# Patient Record
Sex: Male | Born: 1965 | Race: White | Hispanic: No | Marital: Married | Smoking: Former smoker
Health system: Southern US, Community
[De-identification: ages and names within clinical notes are randomized; demographics above are authoritative.]

## PROBLEM LIST (undated history)

## (undated) DIAGNOSIS — E039 Hypothyroidism, unspecified: Secondary | ICD-10-CM

## (undated) DIAGNOSIS — C73 Malignant neoplasm of thyroid gland: Secondary | ICD-10-CM

## (undated) DIAGNOSIS — E079 Disorder of thyroid, unspecified: Secondary | ICD-10-CM

## (undated) DIAGNOSIS — H532 Diplopia: Secondary | ICD-10-CM

## (undated) HISTORY — PX: THYROIDECTOMY: SHX17

---

## 2003-04-07 ENCOUNTER — Encounter: Admission: RE | Admit: 2003-04-07 | Discharge: 2003-04-07 | Payer: Self-pay | Admitting: Family Medicine

## 2003-04-07 ENCOUNTER — Encounter: Payer: Self-pay | Admitting: Family Medicine

## 2003-04-20 ENCOUNTER — Encounter (INDEPENDENT_AMBULATORY_CARE_PROVIDER_SITE_OTHER): Payer: Self-pay | Admitting: *Deleted

## 2003-04-20 ENCOUNTER — Ambulatory Visit (HOSPITAL_COMMUNITY): Admission: RE | Admit: 2003-04-20 | Discharge: 2003-04-20 | Payer: Self-pay | Admitting: General Surgery

## 2003-04-20 ENCOUNTER — Encounter: Payer: Self-pay | Admitting: General Surgery

## 2003-05-15 ENCOUNTER — Encounter: Payer: Self-pay | Admitting: General Surgery

## 2003-05-21 ENCOUNTER — Encounter (INDEPENDENT_AMBULATORY_CARE_PROVIDER_SITE_OTHER): Payer: Self-pay | Admitting: *Deleted

## 2003-05-21 ENCOUNTER — Ambulatory Visit (HOSPITAL_COMMUNITY): Admission: RE | Admit: 2003-05-21 | Discharge: 2003-05-23 | Payer: Self-pay | Admitting: General Surgery

## 2003-05-22 ENCOUNTER — Encounter (INDEPENDENT_AMBULATORY_CARE_PROVIDER_SITE_OTHER): Payer: Self-pay | Admitting: *Deleted

## 2006-05-23 ENCOUNTER — Inpatient Hospital Stay (HOSPITAL_COMMUNITY): Admission: EM | Admit: 2006-05-23 | Discharge: 2006-05-25 | Payer: Self-pay | Admitting: Emergency Medicine

## 2006-07-03 ENCOUNTER — Encounter: Admission: RE | Admit: 2006-07-03 | Discharge: 2006-07-03 | Payer: Self-pay | Admitting: Endocrinology

## 2008-06-02 ENCOUNTER — Encounter: Admission: RE | Admit: 2008-06-02 | Discharge: 2008-06-02 | Payer: Self-pay | Admitting: Internal Medicine

## 2009-03-30 ENCOUNTER — Emergency Department (HOSPITAL_COMMUNITY): Admission: EM | Admit: 2009-03-30 | Discharge: 2009-03-30 | Payer: Self-pay | Admitting: Emergency Medicine

## 2009-06-08 ENCOUNTER — Encounter: Admission: RE | Admit: 2009-06-08 | Discharge: 2009-06-08 | Payer: Self-pay | Admitting: Internal Medicine

## 2009-06-23 ENCOUNTER — Encounter: Admission: RE | Admit: 2009-06-23 | Discharge: 2009-06-23 | Payer: Self-pay | Admitting: Internal Medicine

## 2009-06-23 ENCOUNTER — Other Ambulatory Visit: Admission: RE | Admit: 2009-06-23 | Discharge: 2009-06-23 | Payer: Self-pay | Admitting: Interventional Radiology

## 2009-08-09 ENCOUNTER — Encounter (HOSPITAL_COMMUNITY): Admission: RE | Admit: 2009-08-09 | Discharge: 2009-11-07 | Payer: Self-pay | Admitting: Internal Medicine

## 2010-03-17 ENCOUNTER — Encounter: Admission: RE | Admit: 2010-03-17 | Discharge: 2010-03-17 | Payer: Self-pay | Admitting: Internal Medicine

## 2010-08-28 ENCOUNTER — Encounter: Payer: Self-pay | Admitting: Internal Medicine

## 2010-09-27 ENCOUNTER — Other Ambulatory Visit (HOSPITAL_COMMUNITY): Payer: Self-pay | Admitting: Internal Medicine

## 2010-09-27 DIAGNOSIS — Z85850 Personal history of malignant neoplasm of thyroid: Secondary | ICD-10-CM

## 2010-10-24 ENCOUNTER — Ambulatory Visit (HOSPITAL_COMMUNITY)
Admission: RE | Admit: 2010-10-24 | Discharge: 2010-10-24 | Disposition: A | Discharge status: Home / Self Care | Payer: BC Managed Care – PPO | Source: Ambulatory Visit | Attending: Internal Medicine | Admitting: Internal Medicine

## 2010-10-24 DIAGNOSIS — C73 Malignant neoplasm of thyroid gland: Secondary | ICD-10-CM | POA: Insufficient documentation

## 2010-10-24 DIAGNOSIS — Z85850 Personal history of malignant neoplasm of thyroid: Secondary | ICD-10-CM

## 2010-10-25 ENCOUNTER — Encounter (HOSPITAL_COMMUNITY): Payer: BC Managed Care – PPO | Attending: Internal Medicine

## 2010-10-26 ENCOUNTER — Encounter (HOSPITAL_COMMUNITY)
Admission: RE | Admit: 2010-10-26 | Discharge: 2010-10-26 | Disposition: A | Discharge status: Home / Self Care | Payer: BC Managed Care – PPO | Source: Ambulatory Visit | Attending: Internal Medicine | Admitting: Internal Medicine

## 2010-10-28 ENCOUNTER — Encounter (HOSPITAL_COMMUNITY)
Admission: RE | Admit: 2010-10-28 | Discharge: 2010-10-28 | Disposition: A | Discharge status: Home / Self Care | Payer: BC Managed Care – PPO | Source: Ambulatory Visit | Attending: Internal Medicine | Admitting: Internal Medicine

## 2010-10-28 DIAGNOSIS — C73 Malignant neoplasm of thyroid gland: Secondary | ICD-10-CM | POA: Insufficient documentation

## 2010-10-28 MED ORDER — SODIUM IODIDE I 131 CAPSULE
4.0000 | Freq: Once | INTRAVENOUS | Status: AC | PRN
  Administered 2010-10-27: 4 via ORAL

## 2010-10-31 LAB — THYROGLOBULIN LEVEL: Thyroglobulin: <0.2 ng/mL (ref 0.0–55.0)

## 2010-10-31 LAB — THYROGLOBULIN ANTIBODY: Thyroglobulin Ab: <20 U/mL (ref <40.0)

## 2010-12-23 NOTE — Op Note (Signed)
NAME:  Damon Christensen, Damon Christensen                        ACCOUNT NO.:  [id number]   MEDICAL RECORD NO.:  [id number]                   PATIENT TYPE:  OIB   LOCATION:  5741                                 FACILITY:  [facility]   PHYSICIAN:  Jimmye Norman III, M.D.               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   PROCEDURE:  Completion left hemithyroidectomy.   SURGEON:  Jimmye Norman, M.D.   ASSISTANT:  Sandria Bales. Ezzard Standing, M.D.   ANESTHESIA:  General endotracheal.   ESTIMATED BLOOD LOSS:  50-75 mL   COMPLICATIONS:  None.   CONDITION:  Stable.   FINDINGS:  Very stuck left thyroid gland nodule with an aberrant nodule on  the posterior middle aspect of the thyroid gland very close to the recurrent  laryngeal nerve on the left side. The superior parathyroid gland was  identified and preserved in the upper portion, the inferior one was not.   DESCRIPTION OF PROCEDURE:  The patient was taken to the operating room,  placed on the table in supine position. After an adequate general anesthetic  was administered, he was prepped and draped in the usual sterile manner  exposing his neck.   The patient had three 5-0 nylons in place from his previous operation. These  were cut and opened up the subcutaneous tissue which had 3-0 Vicryls in them  and the platysmal layer. These sutures were also cut and once that was done,  we were able to put in the [place] retractor. The strap muscles had been  reapproximated using 3-0 Vicryl sutures and these were cut. We then used a  green retractor to retract them on the left side as we exposed the left lobe  and thyroid  gland.   There was some hypervascularity of this area which lead to some moderate  amount of bleeding during the initial portion of the case. We bluntly took  down the adhesions to the thyroid gland anteriorly and initially isolated  out the inferior pole. There was a fatty connected tissue structure near the  inferior pole which may have been an inferior parathyroid which was  preserved and left in place. The inferior pole vessels were taken between a  right angled clamp and 4-0 Vicryl ties. We then retracted the thyroid gland  medially taking down the vessels along the thyroid gland itself using  hemoclips small one and Metzenbaum scissors. The superior pole was also  taken down. A sling of hyothyroid muscle which was attached to the gland  which had to be taken down also. Care was taken not to damage any nervous  structures in that area; however, we did take down the superior pole between  the right angled clamp and using a 3-0 Vicryl tie. We continued to rotate  the gland medially as we pushed the connective tissue posteriorly. As we got  to the middle portion of the gland, there was a nodule which was densely  adherent to the trachea which had to be excised away using a #15 blade. This  released it from the trachea preserving the recurrent laryngeal nerve with  was identified in the tracheoesophageal groove going up posteriorly.   We subsequently took the thyroid gland off the trachea using electrocautery.  Hemostasis was obtained primarily with ties, no cautery was used near the  nerve. We did place a piece of Surgical in the sulcus between the trachea  and the esophagus and in the pocket up from the gland. We irrigated with  saline and then we closed in the standard manner.   The strap muscles were reapproximated  using interrupted 3-0 Vicryl sutures.  The platysmal layer was reapproximated using interrupted 3-0 Vicryl sutures.  The skin was closed six interrupted 5-0 nylon  sutures with intervening 1/4  inch Steri-Strips. A sterile dressing was applied including [place] wrap.                                               Kathrin Ruddy, M.D.    JW/MEDQ  D:  05/22/2003  T:  05/23/2003  Job:  [id number]

## 2010-12-23 NOTE — Op Note (Signed)
NAME:  Damon Christensen, Damon Christensen                        ACCOUNT NO.:  [id number]   MEDICAL RECORD NO.:  [id number]                   PATIENT TYPE:  OIB   LOCATION:  5741                                 FACILITY:  [facility]   PHYSICIAN:  Jimmye Norman III, M.D.               DATE OF BIRTH:  1965/11/18   DATE OF PROCEDURE:  05/21/2003  DATE OF DISCHARGE:                                 OPERATIVE REPORT   PREOPERATIVE DIAGNOSIS:  Follicular nodule of the right lobe of the thyroid  gland.   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   PROCEDURE:  Right thyroid lobectomy with isthmusectomy.   SURGEON:  Jimmye Norman, M.D.   ASSISTANT:  Sandria Bales. Ezzard Standing, M.D.   ANESTHESIA:  General endotracheal.   ESTIMATED BLOOD LOSS:  Less than 50 mL.   COMPLICATIONS:  None.   CONDITION:  Stable.   FINDINGS:  The patient had a large 3-3.5 cm nodule of the right thyroid  gland.  Posteriorly and laterally near the recurrent laryngeal nerve, there  was sclerotic attachment of it to the trachea and near the recurrent  laryngeal nerve.  This was easily dissected off, and we were able to  preserve the nerve with minimal difficulty.   OPERATION:  The patient was taken to the operating room and placed on the  table in the supine position.  After an adequate endotracheal anesthetic was  administered, he was prepped and draped in the usual sterile manner,  exposing the neck.   We marked the neck approximately 3 cm above the sternal notch using a 0 silk  tie.  We then made an incision where we marked it with an impression of the  tie on the neck using a #15 blade.  We took it down to and through the  platysma muscle and then dissected a plane superiorly and inferiorly,  identifying and getting down to the strap muscles.  A self-retaining thyroid  retractor was placed in the wound, and we subsequently split the strap  muscles superiorly and inferiorly  right through the midline.  This took [place]  down to a plane where we subsequently could get down to the thyroid gland.  On the right side, we can palpate the nodule very easily.  On the left side,  there was no evidence of malignancy or nodularity.  We used a Green  retractor to retract the hyothyroid muscles towards the right upper quadrant  as we retracted the thyroid gland medially.  The inferior thyroid vessels  were taken initially between a right angle clamp and 4-0 Vicryl ties.  We  then took down the superior pole as we retracted with the Green retractor  and retracted the muscles and pulled down on the thyroid gland towards the  patient's feet while he was in reverse Trendelenburg position.  We were able  to adequately isolate those  vessels and clamp them with a right angle clamp  and tied them off with 4-0 Vicryl ties.  We then retracted the gland more  medially, and there was one nodule which came off of the posterior aspect of  the gland near the middle portion, which was attached intermittently to the  trachea and also to the fascia laterally near the tracheoesophageal groove.  We carefully dissected this free, exposing the recurrent laryngeal nerve  just underneath this 1 cm nodule, which was attached to the gland itself.  We were able to adequately bring the gland anteriorly and medially, exposing  the isthmus.  We subsequently transected the isthmus using electrocautery.  The middle thyroid vessels were also taken between clamps and 4-0 Vicryl  ties.   Hemostasis was adequate at the end.  There were a few small vessels which  had to be clipped with small hemoclips.  However, this was not much of a  problem.  We packed and irrigated, and once the pathology came back on  frozen sections, being likely hyperplasia and/or follicular adenoma, we  closed.   The strap muscles were reapproximated using interrupted stitches of 3-0  Vicryl.  We then reapproximated the platysma layer  using buried stitches of  3-0 Vicryl, and the skin was closed using alternating 0.25-inch Steri-Strips  and simple 4-0 nylon sutures.  Once this was done, a sterile dressing was  applied, including a [place] wrap.                                               Kathrin Ruddy, M.D.    JW/MEDQ  D:  05/21/2003  T:  05/21/2003  Job:  [id number]

## 2010-12-23 NOTE — Discharge Summary (Signed)
NAMEMUNEER, LEIDER              ACCOUNT NO.:  [id number]   MEDICAL RECORD NO.:  [id number]          PATIENT TYPE:  INP   LOCATION:  5710                         FACILITY:  [facility]   PHYSICIAN:  Petra Kuba, M.D.    DATE OF BIRTH:  1965-10-26   DATE OF ADMISSION:  05/23/2006  DATE OF DISCHARGE:  05/25/2006                                 DISCHARGE SUMMARY   Mr. Selsor was admitted on October 17 with a diagnosis of GI bleed.  He was  discharged this morning on October 8 after resolution of his symptoms.   DISCHARGE DIAGNOSES:  Gastrointestinal bleed status post polypectomy.   The service was the gastroenterology service.  The attending was Dr. Vida Rigger. There were no consults.   Mr. Marlin came in on the evening of October 17 complaining of increasing  abdominal pain and what started with pink fluid in the toilet associated  with his bowel movements that had become more reddish.  He was admitted and  placed on clear liquids and IV antibiotics of Unasyn.  He had a CT of his  pelvis which showed that there was difficult infiltration of fat surrounding  the splenic flexure which may have been consistent with either a micro  perforation or some type of abrasion caused by his polypectomy.  He was  advanced from clear liquids to full diet.  He did well.  He was having no  pain when he left, and he had three formed bowel movements yesterday October  19 with no signs of blood and pain.  He was discharged to home this morning  on the same medications that he came in on, which were Levoxyl, calcium D  and a multivitamin.  He is to follow up with Dr. Evette Cristal in two to three  months or sooner if needed.   SPECIAL INSTRUCTIONS:  Call sooner if he has increased bleeding, pain.  He  was told no aspirin or arthritis medications.  Over-the-counter Tylenol is  okay.      Stephani Police, PA    ______________________________  Petra Kuba, M.D.    MLY/MEDQ  D:  05/25/2006  T:   05/27/2006  Job:  [id number]   cc:   Graylin Shiver, M.D.

## 2011-09-18 ENCOUNTER — Other Ambulatory Visit: Payer: Self-pay | Admitting: Internal Medicine

## 2011-09-18 DIAGNOSIS — C73 Malignant neoplasm of thyroid gland: Secondary | ICD-10-CM

## 2011-09-21 ENCOUNTER — Ambulatory Visit
Admission: RE | Admit: 2011-09-21 | Discharge: 2011-09-21 | Disposition: A | Discharge status: Home / Self Care | Payer: BC Managed Care – PPO | Source: Ambulatory Visit | Attending: Internal Medicine | Admitting: Internal Medicine

## 2011-09-21 DIAGNOSIS — C73 Malignant neoplasm of thyroid gland: Secondary | ICD-10-CM

## 2015-09-14 ENCOUNTER — Ambulatory Visit (HOSPITAL_BASED_OUTPATIENT_CLINIC_OR_DEPARTMENT_OTHER): Payer: Self-pay

## 2016-05-07 DIAGNOSIS — H532 Diplopia: Secondary | ICD-10-CM

## 2016-05-07 HISTORY — DX: Diplopia: H53.2

## 2016-05-24 ENCOUNTER — Observation Stay (HOSPITAL_COMMUNITY)
Admission: EM | Admit: 2016-05-24 | Discharge: 2016-05-25 | Disposition: A | Discharge status: Home / Self Care | Payer: BLUE CROSS/BLUE SHIELD | Attending: Family Medicine | Admitting: Family Medicine

## 2016-05-24 ENCOUNTER — Encounter (HOSPITAL_COMMUNITY): Payer: Self-pay | Admitting: *Deleted

## 2016-05-24 ENCOUNTER — Observation Stay (HOSPITAL_COMMUNITY): Payer: BLUE CROSS/BLUE SHIELD

## 2016-05-24 DIAGNOSIS — Z6830 Body mass index (BMI) 30.0-30.9, adult: Secondary | ICD-10-CM | POA: Diagnosis not present

## 2016-05-24 DIAGNOSIS — E669 Obesity, unspecified: Secondary | ICD-10-CM | POA: Diagnosis not present

## 2016-05-24 DIAGNOSIS — E890 Postprocedural hypothyroidism: Secondary | ICD-10-CM | POA: Diagnosis not present

## 2016-05-24 DIAGNOSIS — H539 Unspecified visual disturbance: Secondary | ICD-10-CM | POA: Diagnosis not present

## 2016-05-24 DIAGNOSIS — R079 Chest pain, unspecified: Secondary | ICD-10-CM | POA: Diagnosis present

## 2016-05-24 DIAGNOSIS — Z87891 Personal history of nicotine dependence: Secondary | ICD-10-CM | POA: Insufficient documentation

## 2016-05-24 DIAGNOSIS — E785 Hyperlipidemia, unspecified: Secondary | ICD-10-CM | POA: Insufficient documentation

## 2016-05-24 DIAGNOSIS — F1290 Cannabis use, unspecified, uncomplicated: Secondary | ICD-10-CM | POA: Insufficient documentation

## 2016-05-24 DIAGNOSIS — Z85850 Personal history of malignant neoplasm of thyroid: Secondary | ICD-10-CM | POA: Insufficient documentation

## 2016-05-24 DIAGNOSIS — Z7982 Long term (current) use of aspirin: Secondary | ICD-10-CM | POA: Insufficient documentation

## 2016-05-24 DIAGNOSIS — H532 Diplopia: Secondary | ICD-10-CM | POA: Diagnosis not present

## 2016-05-24 DIAGNOSIS — E039 Hypothyroidism, unspecified: Secondary | ICD-10-CM | POA: Diagnosis not present

## 2016-05-24 DIAGNOSIS — G43109 Migraine with aura, not intractable, without status migrainosus: Secondary | ICD-10-CM | POA: Insufficient documentation

## 2016-05-24 DIAGNOSIS — R0789 Other chest pain: Secondary | ICD-10-CM | POA: Diagnosis not present

## 2016-05-24 DIAGNOSIS — G459 Transient cerebral ischemic attack, unspecified: Secondary | ICD-10-CM | POA: Diagnosis not present

## 2016-05-24 DIAGNOSIS — Z79899 Other long term (current) drug therapy: Secondary | ICD-10-CM | POA: Diagnosis not present

## 2016-05-24 HISTORY — DX: Hypothyroidism, unspecified: E03.9

## 2016-05-24 HISTORY — DX: Malignant neoplasm of thyroid gland: C73

## 2016-05-24 HISTORY — DX: Disorder of thyroid, unspecified: E07.9

## 2016-05-24 HISTORY — DX: Diplopia: H53.2

## 2016-05-24 LAB — TROPONIN I

## 2016-05-24 LAB — APTT: aPTT: 29 seconds (ref 24–36)

## 2016-05-24 LAB — COMPREHENSIVE METABOLIC PANEL
ALBUMIN: 3.9 g/dL (ref 3.5–5.0)
ALT: 28 U/L (ref 17–63)
ANION GAP: 10 (ref 5–15)
AST: 23 U/L (ref 15–41)
Alkaline Phosphatase: 55 U/L (ref 38–126)
BUN: 12 mg/dL (ref 6–20)
CO2: 23 mmol/L (ref 22–32)
Calcium: 9.2 mg/dL (ref 8.9–10.3)
Chloride: 106 mmol/L (ref 101–111)
Creatinine, Ser: 0.83 mg/dL (ref 0.61–1.24)
GFR calc non Af Amer: >60 mL/min (ref >60)
GLUCOSE: 115 mg/dL — AB (ref 65–99)
POTASSIUM: 3.6 mmol/L (ref 3.5–5.1)
SODIUM: 139 mmol/L (ref 135–145)
Total Bilirubin: 0.6 mg/dL (ref 0.3–1.2)
Total Protein: 6.8 g/dL (ref 6.5–8.1)

## 2016-05-24 LAB — I-STAT TROPONIN, ED: TROPONIN I, POC: 0 ng/mL (ref 0.00–0.08)

## 2016-05-24 LAB — DIFFERENTIAL
BASOS PCT: 0 %
Basophils Absolute: 0 10*3/uL (ref 0.0–0.1)
EOS ABS: 0.2 10*3/uL (ref 0.0–0.7)
EOS PCT: 2 %
LYMPHS PCT: 26 %
Lymphs Abs: 2.8 10*3/uL (ref 0.7–4.0)
MONO ABS: 0.4 10*3/uL (ref 0.1–1.0)
Monocytes Relative: 4 %
NEUTROS PCT: 68 %
Neutro Abs: 7.1 10*3/uL (ref 1.7–7.7)

## 2016-05-24 LAB — CBC
HCT: 42.5 % (ref 39.0–52.0)
Hemoglobin: 15.3 g/dL (ref 13.0–17.0)
MCH: 32.3 pg (ref 26.0–34.0)
MCHC: 36 g/dL (ref 30.0–36.0)
MCV: 89.7 fL (ref 78.0–100.0)
PLATELETS: 198 10*3/uL (ref 150–400)
RBC: 4.74 MIL/uL (ref 4.22–5.81)
RDW: 12 % (ref 11.5–15.5)
WBC: 10.5 10*3/uL (ref 4.0–10.5)

## 2016-05-24 LAB — PROTIME-INR
INR: 1.02
PROTHROMBIN TIME: 13.4 s (ref 11.4–15.2)

## 2016-05-24 LAB — RAPID URINE DRUG SCREEN, HOSP PERFORMED
AMPHETAMINES: NOT DETECTED
BENZODIAZEPINES: NOT DETECTED
Barbiturates: NOT DETECTED
Cocaine: NOT DETECTED
OPIATES: NOT DETECTED
Tetrahydrocannabinol: POSITIVE — AB

## 2016-05-24 MED ORDER — ASPIRIN 300 MG RE SUPP: 300.0000 mg | Freq: Every day | RECTAL | Status: DC

## 2016-05-24 MED ORDER — SENNOSIDES-DOCUSATE SODIUM 8.6-50 MG PO TABS: 1.0000 | ORAL_TABLET | Freq: Every evening | ORAL | Status: DC | PRN

## 2016-05-24 MED ORDER — ASPIRIN 325 MG PO TABS
325.0000 mg | ORAL_TABLET | Freq: Every day | ORAL | Status: DC
  Filled 2016-05-24: qty 1

## 2016-05-24 MED ORDER — ENOXAPARIN SODIUM 40 MG/0.4ML ~~LOC~~ SOLN
40.0000 mg | SUBCUTANEOUS | Status: DC
  Administered 2016-05-25: 40 mg via SUBCUTANEOUS
  Filled 2016-05-24: qty 0.4

## 2016-05-24 MED ORDER — SODIUM CHLORIDE 0.9 % IV SOLN
INTRAVENOUS | Status: DC
  Administered 2016-05-25: 01:00:00 via INTRAVENOUS

## 2016-05-24 MED ORDER — LEVOTHYROXINE SODIUM 175 MCG PO TABS
175.0000 ug | ORAL_TABLET | Freq: Every day | ORAL | Status: DC
  Filled 2016-05-24: qty 1

## 2016-05-24 MED ORDER — STROKE: EARLY STAGES OF RECOVERY BOOK
Freq: Once | Status: AC
  Administered 2016-05-25: 16:00:00
  Filled 2016-05-24 (×3): qty 1

## 2016-05-24 NOTE — ED Provider Notes (Signed)
[facility] DEPT Provider Note   CSN: [id number] Arrival date & time: 05/24/16  1252     History   Chief Complaint Chief Complaint  Patient presents with  . Headache    HPI Damon Christensen is a 50 y.o. male.  The history is provided by the patient (joined in ED by male companion).  Eye Problem   This is a new problem. The current episode started yesterday. The problem occurs constantly. The problem has been resolved. There is a problem in both eyes. There was no injury mechanism. The patient is experiencing no pain. There is no history of trauma to the eye. Associated symptoms include blurred vision, decreased vision and double vision. Pertinent negatives include no numbness, no discharge, no photophobia, no nausea, no vomiting and no weakness. He has tried nothing for the symptoms.    Past Medical History:  Diagnosis Date  . Hypothyroidism   . Thyroid cancer ([place])   . Thyroid disease     Patient Active Problem List   Diagnosis Date Noted  . Diplopia 05/24/2016  . Chest pain 05/24/2016  . Visual disturbance 05/24/2016  . Hypothyroidism     Past Surgical History:  Procedure Laterality Date  . THYROIDECTOMY         Home Medications    Prior to Admission medications   Medication Sig Start Date End Date Taking? Authorizing Provider  levothyroxine (SYNTHROID, LEVOTHROID) 175 MCG tablet Take 175 mcg by mouth daily before breakfast.   Yes Historical Provider, MD    Family History Family History  Problem Relation Age of Onset  . Uterine cancer Mother   . Pancreatic cancer Father   . Diabetes Mellitus I Father   . Kidney disease Brother     Social History Social History  Substance Use Topics  . Smoking status: Former Research scientist (life sciences)  . Smokeless tobacco: Not on file  . Alcohol use Yes     Comment: social drinking     Allergies   Review of patient's allergies indicates no known allergies.   Review of Systems Review of Systems  Constitutional: Negative  for chills, diaphoresis, fatigue and fever.  HENT: Negative for congestion.   Eyes: Positive for blurred vision and double vision. Negative for photophobia and discharge.  Respiratory: Negative for shortness of breath.   Cardiovascular: Positive for chest pain. Negative for leg swelling.       Intermittent nonradiating episodes of chest discomfort x few weeks, none at onset of diplopia yesterday. Most recent episode of chest discomfort was today  Gastrointestinal: Negative for abdominal pain, nausea and vomiting.  Genitourinary: Negative for flank pain.  Musculoskeletal: Negative for back pain.  Skin: Negative for rash.  Neurological: Negative for tremors, facial asymmetry, speech difficulty, weakness, light-headedness, numbness and headaches.  Hematological: Does not bruise/bleed easily.  Psychiatric/Behavioral: Negative for confusion.     Physical Exam Updated Vital Signs BP 113/97   Pulse 72   Temp 97.9 F (36.6 C) (Oral)   Resp 18   SpO2 94%   Physical Exam  Constitutional: He is oriented to person, place, and time. He appears well-developed and well-nourished. No distress.  Pleasant, cooperative, well-appearing  HENT:  Head: Normocephalic and atraumatic.  Eyes: Conjunctivae and EOM are normal. Pupils are equal, round, and reactive to light. No scleral icterus.  Intact peripheral and central vision as tested  Neck: Normal range of motion. Neck supple.  Cardiovascular: Normal rate, regular rhythm and intact distal pulses.  Exam reveals no gallop and no friction rub.  Pulmonary/Chest: Effort normal and breath sounds normal. No respiratory distress.  Abdominal: Soft. He exhibits no distension. There is no tenderness.  Musculoskeletal: Normal range of motion. He exhibits no edema or tenderness.  Neurological: He is alert and oriented to person, place, and time. No cranial nerve deficit. He exhibits normal muscle tone. Coordination normal.  5/5 strength throughout b/l Ue's and  b/l Le's. Normal speech. Normal finger-to-nose, normal rapid-alternating-movements bilaterally. Intact sensation throughout  Skin: Skin is warm and dry. Capillary refill takes less than 2 seconds. No rash noted. He is not diaphoretic. No pallor.  Psychiatric: He has a normal mood and affect.  Nursing note and vitals reviewed.    ED Treatments / Results  Labs (all labs ordered are listed, but only abnormal results are displayed) Labs Reviewed  COMPREHENSIVE METABOLIC PANEL - Abnormal; Notable for the following:       Result Value   Glucose, Bld 115 (*)    All other components within normal limits  RAPID URINE DRUG SCREEN, HOSP PERFORMED - Abnormal; Notable for the following:    Tetrahydrocannabinol POSITIVE (*)    All other components within normal limits  PROTIME-INR  APTT  CBC  DIFFERENTIAL  TROPONIN I  TROPONIN I  TROPONIN I  HEMOGLOBIN A1C  LIPID PANEL  TSH  I-STAT TROPOININ, ED    EKG  EKG Interpretation None       Radiology No results found.  Procedures Procedures (including critical care time)  Medications Ordered in ED Medications  levothyroxine (SYNTHROID, LEVOTHROID) tablet 175 mcg (not administered)   stroke: mapping our early stages of recovery book (not administered)  0.9 %  sodium chloride infusion (not administered)  senna-docusate (Senokot-S) tablet 1 tablet (not administered)  enoxaparin (LOVENOX) injection 40 mg (not administered)  aspirin suppository 300 mg (not administered)    Or  aspirin tablet 325 mg (not administered)     Initial Impression / Assessment and Plan / ED Course  I have reviewed the triage vital signs and the nursing notes.  Pertinent labs & imaging results that were available during my care of the patient were reviewed by me and considered in my medical decision making (see chart for details).  Clinical Course   Damon Christensen is a 50 y.o. male with h/o thyroid cancer s/p thyroidectomy, denies all other medical  problems, who presents to ED for evaluation of 5 minute episode of sudden-onset "diagonal" diplopia yesterday while standing, making it difficult to walk, resolved spontaneously and has not reoccurred. Pt denies associated arm/leg numbness/weakness. Pt does endorse several weeks of intermittent "prisms" in b/l eyes, for which he was seen by an optometrist and told his retinas are normal, that the optometrist suspects ocular migraine. Pt denies any associated headaches. No ongoing visual disturbances, none currently. Pt also endorses several weeks of intermittent substernal nonradiating chest discomfort. States he is here for evaluation of his diplopia, concern for TIA/CVA. No correlation between occurrence of the chest discomfort and visual changes. Doubt dissection. Admitted to medicine for CVA/TIA workup, neurology consulted to follow. Recommend outpatient stress test of heart in addition to inpatient workup for TIA.  Pt condition, course, and admission were discussed with attending physician Dr. Ezequiel Essex.  Final Clinical Impressions(s) / ED Diagnoses   Final diagnoses:  Diplopia  Hypothyroidism, unspecified type    New Prescriptions New Prescriptions   No medications on file     Paralee Cancel, MD 05/24/16 [place], MD 05/25/16 [phone]

## 2016-05-24 NOTE — ED Notes (Signed)
Patient transported to MRI 

## 2016-05-24 NOTE — ED Notes (Signed)
Pt ambulated to room from waiting room, tolerated well. 

## 2016-05-24 NOTE — Consult Note (Signed)
Admission H&P    Chief Complaint: Transient diplopia.  HPI: Damon Christensen is an 50 y.o. male history of thyroid cancer and hypothyroidism who experienced an episode of diplopia on 05/23/2016 which lasted about 5 minutes. Images were clearly separated and diagonal an arrangement. There was no associated headache. He had no dizziness. There was no focal weakness. Speech remained unchanged. He is also complaining of episodes of visual change involving the left peripheral visual field the crescent shaped area of blurred vision and scintillating scotomas. Most recent episode occurred today and lasted only a few minutes. Onset was sudden rather than gradual. He had a mild headache associated with today's symptoms. MRI of his brain tonight showed no signs of an acute stroke. MRA was unremarkable.  LSN: 12:00 noon on 05/23/2016 tPA Given: No: Deficits rapidly resolved; beyond time window for treatment consideration mRankin:  Past Medical History:  Diagnosis Date  . Hypothyroidism   . Thyroid cancer ([place])   . Thyroid disease     Past Surgical History:  Procedure Laterality Date  . THYROIDECTOMY      Family History  Problem Relation Age of Onset  . Uterine cancer Mother   . Pancreatic cancer Father   . Diabetes Mellitus I Father   . Kidney disease Brother    Social History:  reports that he has quit smoking. He does not have any smokeless tobacco history on file. He reports that he drinks alcohol. He reports that he does not use drugs.  Allergies: No Known Allergies  Medications Prior to Admission  Medication Sig Dispense Refill  . levothyroxine (SYNTHROID, LEVOTHROID) 175 MCG tablet Take 175 mcg by mouth daily before breakfast.      ROS: History obtained from the patient  General ROS: negative for - chills, fatigue, fever, night sweats, weight gain or weight loss Psychological ROS: negative for - behavioral disorder, hallucinations, memory difficulties, mood swings or suicidal  ideation Ophthalmic ROS: As noted in present illness ENT ROS: negative for - epistaxis, nasal discharge, oral lesions, sore throat, tinnitus or vertigo Allergy and Immunology ROS: negative for - hives or itchy/watery eyes Hematological and Lymphatic ROS: negative for - bleeding problems, bruising or swollen lymph nodes Endocrine ROS: negative for - galactorrhea, hair pattern changes, polydipsia/polyuria or temperature intolerance Respiratory ROS: negative for - cough, hemoptysis, shortness of breath or wheezing Cardiovascular ROS: negative for - chest pain, dyspnea on exertion, edema or irregular heartbeat Gastrointestinal ROS: negative for - abdominal pain, diarrhea, hematemesis, nausea/vomiting or stool incontinence Genito-Urinary ROS: negative for - dysuria, hematuria, incontinence or urinary frequency/urgency Musculoskeletal ROS: negative for - joint swelling or muscular weakness Neurological ROS: as noted in HPI Dermatological ROS: negative for rash and skin lesion changes  Physical Examination: Blood pressure 132/77, pulse (!) 59, temperature 97.5 F (36.4 C), temperature source Oral, resp. rate 16, SpO2 98 %.  HEENT-  Normocephalic, no lesions, without obvious abnormality.  Normal external eye and conjunctiva.  Normal TM's bilaterally.  Normal auditory canals and external ears. Normal external nose, mucus membranes and septum.  Normal pharynx. Neck supple with no masses, nodes, nodules or enlargement. Cardiovascular - regular rate and rhythm, S1, S2 normal, no murmur, click, rub or gallop Lungs - chest clear, no wheezing, rales, normal symmetric air entry Abdomen - soft, non-tender; bowel sounds normal; no masses,  no organomegaly Extremities - no joint deformities, effusion, or inflammation  Neurologic Examination: Mental Status: Alert, oriented, thought content appropriate.  Speech fluent without evidence of aphasia. Able to follow commands  without difficulty. Cranial  Nerves: II-Visual fields were normal. III/IV/VI-Pupils were equal and reacted normally to light. Extraocular movements were full and conjugate.    V/VII-no facial numbness and no facial weakness. VIII-normal. X-normal speech and symmetrical palatal movement. XI: trapezius strength/neck flexion strength normal bilaterally XII-midline tongue extension with normal strength. Motor: 5/5 bilaterally with normal tone and bulk Sensory: Normal throughout. Deep Tendon Reflexes: 2+ and symmetric. Plantars: Flexor bilaterally Cerebellar: Normal finger-to-nose testing. Carotid auscultation: Normal  Results for orders placed or performed during the hospital encounter of 05/24/16 (from the past 48 hour(s))  Protime-INR     Status: None   Collection Time: 05/24/16  7:08 PM  Result Value Ref Range   Prothrombin Time 13.4 11.4 - 15.2 seconds   INR 1.02   APTT     Status: None   Collection Time: 05/24/16  7:08 PM  Result Value Ref Range   aPTT 29 24 - 36 seconds  CBC     Status: None   Collection Time: 05/24/16  7:08 PM  Result Value Ref Range   WBC 10.5 4.0 - 10.5 K/uL   RBC 4.74 4.22 - 5.81 MIL/uL   Hemoglobin 15.3 13.0 - 17.0 g/dL   HCT 42.5 39.0 - 52.0 %   MCV 89.7 78.0 - 100.0 fL   MCH 32.3 26.0 - 34.0 pg   MCHC 36.0 30.0 - 36.0 g/dL   RDW 12.0 11.5 - 15.5 %   Platelets 198 150 - 400 K/uL  Differential     Status: None   Collection Time: 05/24/16  7:08 PM  Result Value Ref Range   Neutrophils Relative % 68 %   Neutro Abs 7.1 1.7 - 7.7 K/uL   Lymphocytes Relative 26 %   Lymphs Abs 2.8 0.7 - 4.0 K/uL   Monocytes Relative 4 %   Monocytes Absolute 0.4 0.1 - 1.0 K/uL   Eosinophils Relative 2 %   Eosinophils Absolute 0.2 0.0 - 0.7 K/uL   Basophils Relative 0 %   Basophils Absolute 0.0 0.0 - 0.1 K/uL  Comprehensive metabolic panel     Status: Abnormal   Collection Time: 05/24/16  7:08 PM  Result Value Ref Range   Sodium 139 135 - 145 mmol/L   Potassium 3.6 3.5 - 5.1 mmol/L    Chloride 106 101 - 111 mmol/L   CO2 23 22 - 32 mmol/L   Glucose, Bld 115 (H) 65 - 99 mg/dL   BUN 12 6 - 20 mg/dL   Creatinine, Ser 0.83 0.61 - 1.24 mg/dL   Calcium 9.2 8.9 - 10.3 mg/dL   Total Protein 6.8 6.5 - 8.1 g/dL   Albumin 3.9 3.5 - 5.0 g/dL   AST 23 15 - 41 U/L   ALT 28 17 - 63 U/L   Alkaline Phosphatase 55 38 - 126 U/L   Total Bilirubin 0.6 0.3 - 1.2 mg/dL   GFR calc non Af Amer >60 >60 mL/min   GFR calc Af Amer >60 >60 mL/min    Comment: (NOTE) The eGFR has been calculated using the CKD EPI equation. This calculation has not been validated in all clinical situations. eGFR's persistently <60 mL/min signify possible Chronic Kidney Disease.    Anion gap 10 5 - 15  Troponin I (q 6hr x 3)     Status: None   Collection Time: 05/24/16  7:08 PM  Result Value Ref Range   Troponin I <0.03 <0.03 ng/mL  I-stat troponin, ED (not at [facility], [facility])  Status: None   Collection Time: 05/24/16  7:30 PM  Result Value Ref Range   Troponin i, poc 0.00 0.00 - 0.08 ng/mL   Comment 3            Comment: Due to the release kinetics of cTnI, a negative result within the first hours of the onset of symptoms does not rule out myocardial infarction with certainty. If myocardial infarction is still suspected, repeat the test at appropriate intervals.   Urine rapid drug screen (hosp performed)     Status: Abnormal   Collection Time: 05/24/16  8:10 PM  Result Value Ref Range   Opiates NONE DETECTED NONE DETECTED   Cocaine NONE DETECTED NONE DETECTED   Benzodiazepines NONE DETECTED NONE DETECTED   Amphetamines NONE DETECTED NONE DETECTED   Tetrahydrocannabinol POSITIVE (A) NONE DETECTED   Barbiturates NONE DETECTED NONE DETECTED    Comment:        DRUG SCREEN FOR MEDICAL PURPOSES ONLY.  IF CONFIRMATION IS NEEDED FOR ANY PURPOSE, NOTIFY LAB WITHIN 5 DAYS.        LOWEST DETECTABLE LIMITS FOR URINE DRUG SCREEN Drug Class       Cutoff (ng/mL) Amphetamine      1000 Barbiturate       200 Benzodiazepine   629 Tricyclics       528 Opiates          300 Cocaine          300 THC              50    Mr Brain Wo Contrast  Result Date: 05/24/2016 CLINICAL DATA:  Double vision beginning yesterday. Headache today. Recent diagnosis of the optical migraine. EXAM: MRI HEAD WITHOUT CONTRAST MRA HEAD WITHOUT CONTRAST TECHNIQUE: Multiplanar, multiecho pulse sequences of the brain and surrounding structures were obtained without intravenous contrast. Angiographic images of the head were obtained using MRA technique without contrast. COMPARISON:  CT 03/30/2009 FINDINGS: MRI HEAD FINDINGS Brain: Diffusion imaging does not show any acute or subacute infarction. Brainstem and cerebellum are normal. Cerebral hemispheres are normal except for a few scattered punctate foci of T2 and FLAIR signal in the white matter. These can be seen in normal individuals of this age or could represent small vessel insults either due to small-vessel disease, vasculitis, trauma or migraine related foci. No cortical or large vessel territory infarction. No mass lesion, hemorrhage, hydrocephalus or extra-axial collection. No pituitary mass. Vascular: Major vessels at the base of the brain show flow. Skull and upper cervical spine: Negative Sinuses/Orbits: Mild mucosal inflammation of the paranasal sinuses without evidence of advanced sinus disease. Other: None significant MRA HEAD FINDINGS Both internal carotid arteries are widely patent into the brain. The anterior and middle cerebral vessels are normal without proximal stenosis, aneurysm or vascular malformation. Both vertebral arteries are widely patent to the basilar. No basilar stenosis. Posterior circulation branch vessels are normal. IMPRESSION: Normal intracranial MR angiography. No acute or likely significant brain pathology. Few punctate white matter foci which can be seen in normal individuals. Alternatively, they could represent an early manifestation of small vessel  disease, evidence of previous vasculitis or trauma, or could be migraine related foci. Electronically Signed   By: Nelson Chimes M.D.   On: 05/24/2016 21:09   Mr Jodene Nam Head/brain UX Cm  Result Date: 05/24/2016 CLINICAL DATA:  Double vision beginning yesterday. Headache today. Recent diagnosis of the optical migraine. EXAM: MRI HEAD WITHOUT CONTRAST MRA HEAD WITHOUT CONTRAST TECHNIQUE: Multiplanar, multiecho pulse  sequences of the brain and surrounding structures were obtained without intravenous contrast. Angiographic images of the head were obtained using MRA technique without contrast. COMPARISON:  CT 03/30/2009 FINDINGS: MRI HEAD FINDINGS Brain: Diffusion imaging does not show any acute or subacute infarction. Brainstem and cerebellum are normal. Cerebral hemispheres are normal except for a few scattered punctate foci of T2 and FLAIR signal in the white matter. These can be seen in normal individuals of this age or could represent small vessel insults either due to small-vessel disease, vasculitis, trauma or migraine related foci. No cortical or large vessel territory infarction. No mass lesion, hemorrhage, hydrocephalus or extra-axial collection. No pituitary mass. Vascular: Major vessels at the base of the brain show flow. Skull and upper cervical spine: Negative Sinuses/Orbits: Mild mucosal inflammation of the paranasal sinuses without evidence of advanced sinus disease. Other: None significant MRA HEAD FINDINGS Both internal carotid arteries are widely patent into the brain. The anterior and middle cerebral vessels are normal without proximal stenosis, aneurysm or vascular malformation. Both vertebral arteries are widely patent to the basilar. No basilar stenosis. Posterior circulation branch vessels are normal. IMPRESSION: Normal intracranial MR angiography. No acute or likely significant brain pathology. Few punctate white matter foci which can be seen in normal individuals. Alternatively, they could  represent an early manifestation of small vessel disease, evidence of previous vasculitis or trauma, or could be migraine related foci. Electronically Signed   By: Nelson Chimes M.D.   On: 05/24/2016 21:09    Assessment: 50 y.o. male presenting with possible transient ischemic attack involving midbrain or pons, with transient diplopia. Patient is also describing symptoms suggestive of migraine equivalents with visual phenomena. However, these may well be infestation's of TIA as well, particularly with suddenness of onset of these symptoms.  Stroke Risk Factors - family history  Plan: 1. HgbA1c, fasting lipid panel 2. MRI, MRA  of the brain without contrast 3. Hypercoagulopathy panel 4. Echocardiogram 5. Carotid dopplers 6. Prophylactic therapy-Antiplatelet med: Aspirin  7. Risk factor modification 8. Telemetry monitoring  C.R. Nicole Kindred, MD [facility] [phone]  05/24/2016, 11:38 PM

## 2016-05-24 NOTE — H&P (Signed)
History and Physical    Damon Christensen [id number] DOB: July 24, 1966 DOA: 05/24/2016  Referring MD/NP/PA:   PCP: No primary care provider on file.   Patient coming from:  The patient is coming from home.  At baseline, pt is independent for most of ADL.   Chief Complaint: visual disturbance  HPI: Damon Christensen is a 50 y.o. male with medical history significant of thyroid cancer, s/p of thyroidectomy and secondary hypothyroidism, who presents with visual disturbance.  Pt states that he has intermittent visual disturbance in the past 6 months. It happened 5-6 times. He described as "prisms in his vision". Each time it lasts for about 5 minutes, then resolves spontaneously. He was seen by eye doctor two weeks ago and was told that his retina is fine. He was given a diagnosis of "ocular migraine. Pt states that he had one episode of double vision at noon yesterday, which lasted for about 5 min and resolved spontaneously. Patient states that occasionally he has numbness in the right thigh. Currently no unilateral weakness, numbness or tingling in extremities. 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   Pt also reports intermittent mild chest pain which has been going on for more than 2 years. The chest pain is located in the substernal area, pressure-like, 4-5 out of 10 in severity, nonradiating. It happens approximately once a week, lasts for about 5 min, resolvs spontaneously. Patient does not have shortness of breath, tenderness over calf areas. No fever or chills. Currently no chest pain. Patient denies nausea, vomiting, diarrhea, abdominal pain or symptoms of UTI.  ED Course: pt was found to have  normal temperature, no tachycardia, no tachypnea, oxygen saturation normal, blood pressure normal. Pending CMP and CBC. Pt is placed on tele bed for obs. Neurology will be consulted by EDP.    Review of Systems:   General: no  fevers, chills, no changes in body weight, has fatigue HEENT: has visual disturbance and ear ringing. Respiratory: no dyspnea, coughing, wheezing CV: has chest pain, no palpitations GI: no nausea, vomiting, abdominal pain, diarrhea, constipation GU: no dysuria, burning on urination, increased urinary frequency, hematuria  Ext: no leg edema Neuro: has double vision and visual disturbance. Has numbness in right thigh. Has ear ringing. Skin: no rash, no skin tear. MSK: No muscle spasm, no deformity, no limitation of range of movement in spin Heme: No easy bruising.  Travel history: No recent long distant travel.  Allergy: No Known Allergies  Past Medical History:  Diagnosis Date  . Hypothyroidism   . Thyroid cancer ([place])   . Thyroid disease     Past Surgical History:  Procedure Laterality Date  . THYROIDECTOMY      Social History:  reports that he has quit smoking. He does not have any smokeless tobacco history on file. He reports that he drinks alcohol. He reports that he does not use drugs.  Family History:  Family History  Problem Relation Age of Onset  . Uterine cancer Mother   . Pancreatic cancer Father   . Diabetes Mellitus I Father   . Kidney disease Brother      Prior to Admission medications   Medication Sig Start Date End Date Taking? Authorizing Provider  levothyroxine (SYNTHROID, LEVOTHROID) 175 MCG tablet Take 175 mcg by mouth daily before breakfast.   Yes Historical Provider, MD    Physical Exam: Vitals:   05/24/16 1743 05/24/16 1915 05/24/16 1930 05/24/16 1945  BP: 125/76 127/92 (!) 134/106 113/97  Pulse: 74 72 79 72  Resp: 18 17 (!) 27 18  Temp:      TempSrc:      SpO2: 98% 97% 98% 94%   General: Not in acute distress HEENT:       Eyes: PERRL, EOMI, no scleral icterus.       ENT: No discharge from the ears and nose, no pharynx injection, no tonsillar enlargement.        Neck: No JVD, no bruit, no mass felt. Heme: No neck lymph node  enlargement. Cardiac: S1/S2, RRR, No murmurs, No gallops or rubs. Respiratory:  No rales, wheezing, rhonchi or rubs. GI: Soft, nondistended, nontender, no rebound pain, no organomegaly, BS present. GU: No hematuria Ext: No pitting leg edema bilaterally. 2+DP/PT pulse bilaterally. Musculoskeletal: No joint deformities, No joint redness or warmth, no limitation of ROM in spin. Skin: No rashes.  Neuro: Alert, oriented X3, cranial nerves II-XII grossly intact, moves all extremities normally. Muscle strength 5/5 in all extremities, sensation to light touch intact. Brachial reflex 2+ bilaterally. Knee reflex 1+ bilaterally. Negative Babinski's sign. Normal finger to nose test. Psych: Patient is not psychotic, no suicidal or hemocidal ideation.  Labs on Admission: I have personally reviewed following labs and imaging studies  CBC:  Recent Labs Lab 05/24/16 1908  WBC 10.5  NEUTROABS 7.1  HGB 15.3  HCT 42.5  MCV 89.7  PLT 99991111   Basic Metabolic Panel: No results for input(s): NA, K, CL, CO2, GLUCOSE, BUN, CREATININE, CALCIUM, MG, PHOS in the last 168 hours. GFR: CrCl cannot be calculated (Unknown ideal weight.). Liver Function Tests: No results for input(s): AST, ALT, ALKPHOS, BILITOT, PROT, ALBUMIN in the last 168 hours. No results for input(s): LIPASE, AMYLASE in the last 168 hours. No results for input(s): AMMONIA in the last 168 hours. Coagulation Profile:  Recent Labs Lab 05/24/16 1908  INR 1.02   Cardiac Enzymes: No results for input(s): CKTOTAL, CKMB, CKMBINDEX, TROPONINI in the last 168 hours. BNP (last 3 results) No results for input(s): PROBNP in the last 8760 hours. HbA1C: No results for input(s): HGBA1C in the last 72 hours. CBG: No results for input(s): GLUCAP in the last 168 hours. Lipid Profile: No results for input(s): CHOL, HDL, LDLCALC, TRIG, CHOLHDL, LDLDIRECT in the last 72 hours. Thyroid Function Tests: No results for input(s): TSH, T4TOTAL, FREET4,  T3FREE, THYROIDAB in the last 72 hours. Anemia Panel: No results for input(s): VITAMINB12, FOLATE, FERRITIN, TIBC, IRON, RETICCTPCT in the last 72 hours. Urine analysis: No results found for: COLORURINE, APPEARANCEUR, LABSPEC, PHURINE, GLUCOSEU, HGBUR, BILIRUBINUR, KETONESUR, PROTEINUR, UROBILINOGEN, NITRITE, LEUKOCYTESUR Sepsis Labs: @LABRCNTIP (procalcitonin:4,lacticidven:4) )No results found for this or any previous visit (from the past 240 hour(s)).   Radiological Exams on Admission: No results found.   EKG: Not done in ED, will get one.   Assessment/Plan Principal Problem:   Diplopia Active Problems:   Hypothyroidism   Chest pain   Visual disturbance   Diplopia and visual disturbance: Etiology is not clear. Potential differential diagnosis includes TIA, multiple sclerosis and ocular nerve neuropathy. Per neurology, Dr. Nicole Kindred, pt's symptoms are suggestive of migraine equivalents with visual phenomena. However, TIA is also possible.   - will place pt on tele bed for obs. -Highly appreciate Dr. Nicole Kindred consultation, follow-up recommendations as follows:  1. HgbA1c, fasting lipid panel 2. MRI, MRA  of the brain without contrast 3. Hypercoagulopathy panel 4. Echocardiogram 5. Carotid dopplers 6. Prophylactic therapy-Antiplatelet med: Aspirin  7. Risk  factor modification 8. Telemetry monitoring  Hypothyroidism 2/2 to s/p of thyroidectomy due to thyroid cancer. -Continue home Synthroid -Check TSH  Chest pain: Patient has been having intermittent atypical chest pain for 2 years. Etiology is not clear. -Follow-up troponin x 3 and 2d echo -on ASA -check EKG   DVT ppx: sQ Lovenox Code Status: Full code Family Communication: Yes, patient's wife at bed side Disposition Plan:  Anticipate discharge back to previous home environment Consults called:  Neurology will be consulted by EDP Admission status: Obs / tele      Date of Service 05/24/2016    Ivor Costa [facility] Pager [phone]  If 7PM-7AM, please contact night-coverage [email or web address] Password [email or web address] 05/24/2016, 8:03 PM

## 2016-05-24 NOTE — ED Notes (Signed)
Attempted to call report

## 2016-05-24 NOTE — ED Triage Notes (Signed)
Pt reports that he has had double vision yesterday. Pt states that he then had a headache today with "prisms in his vision". 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 Pt reports that vision has improved today.

## 2016-05-25 ENCOUNTER — Observation Stay (HOSPITAL_BASED_OUTPATIENT_CLINIC_OR_DEPARTMENT_OTHER): Payer: BLUE CROSS/BLUE SHIELD

## 2016-05-25 ENCOUNTER — Observation Stay (HOSPITAL_COMMUNITY): Payer: BLUE CROSS/BLUE SHIELD

## 2016-05-25 ENCOUNTER — Encounter (HOSPITAL_COMMUNITY): Payer: Self-pay | Admitting: General Practice

## 2016-05-25 DIAGNOSIS — H532 Diplopia: Secondary | ICD-10-CM | POA: Diagnosis not present

## 2016-05-25 DIAGNOSIS — R079 Chest pain, unspecified: Secondary | ICD-10-CM

## 2016-05-25 DIAGNOSIS — H539 Unspecified visual disturbance: Secondary | ICD-10-CM

## 2016-05-25 DIAGNOSIS — G459 Transient cerebral ischemic attack, unspecified: Secondary | ICD-10-CM | POA: Diagnosis not present

## 2016-05-25 DIAGNOSIS — E039 Hypothyroidism, unspecified: Secondary | ICD-10-CM | POA: Diagnosis not present

## 2016-05-25 LAB — ECHOCARDIOGRAM COMPLETE
E decel time: 183 msec
E/e' ratio: 6.96
FS: 30 % (ref 28–44)
Height: 72 in
IVS/LV PW RATIO, ED: 0.83
LA ID, A-P, ES: 32 mm
LADIAMINDEX: 1.42 cm/m2
LAVOLA4C: 44.4 mL
LDCA: 3.14 cm2
LEFT ATRIUM END SYS DIAM: 32 mm
LV E/e' medial: 6.96
LV E/e'average: 6.96
LV PW d: 12 mm — AB (ref 0.6–1.1)
LV TDI E'MEDIAL: 7.72
LV e' LATERAL: 14 cm/s
LVOTD: 20 mm
MV Dec: 183
MV Peak grad: 4 mmHg
MV pk A vel: 54.8 m/s
MV pk E vel: 97.5 m/s
RV LATERAL S' VELOCITY: 12.2 cm/s
TDI e' lateral: 14
Weight: 3648 oz

## 2016-05-25 LAB — LIPID PANEL
CHOL/HDL RATIO: 7.9 ratio
CHOLESTEROL: 182 mg/dL (ref 0–200)
HDL: 23 mg/dL — ABNORMAL LOW (ref >40)
LDL CALC: 105 mg/dL — AB (ref 0–99)
TRIGLYCERIDES: 270 mg/dL — AB (ref <150)
VLDL: 54 mg/dL — AB (ref 0–40)

## 2016-05-25 LAB — TSH: TSH: 1.065 u[IU]/mL (ref 0.350–4.500)

## 2016-05-25 LAB — GLUCOSE, CAPILLARY: Glucose-Capillary: 94 mg/dL (ref 65–99)

## 2016-05-25 LAB — TROPONIN I

## 2016-05-25 MED ORDER — ASPIRIN 81 MG PO TABS: 81.0000 mg | ORAL_TABLET | Freq: Every day | ORAL | 0 refills | Status: AC

## 2016-05-25 NOTE — Progress Notes (Signed)
OT Cancellation Note  Patient Details Name: Damon Christensen MRN: [id number] DOB: 05-20-1966   Cancelled Treatment:    Reason Eval/Treat Not Completed: OT screened, no needs identified, will sign off. Pt reports that double vision has resolved; pt with no other deficits  Britt Bottom 05/25/2016, 11:04 AM

## 2016-05-25 NOTE — Progress Notes (Signed)
SLP Cancellation Note  Patient Details Name: Damon Christensen MRN: [id number] DOB: 1966-05-11   Cancelled treatment:       Reason Eval/Treat Not Completed: SLP screened, no needs identified, will sign off   Rokhaya Quinn, Katherene Ponto 05/25/2016, 2:49 PM

## 2016-05-25 NOTE — Discharge Summary (Signed)
Physician Discharge Summary  Damon Christensen [id number] DOB: [birth year] DOA: 05/24/2016  PCP: Gara Kroner, MD  Admit date: 05/24/2016 Discharge date: 05/25/2016  Admitted From: Home Disposition:  Home  Recommendations for Outpatient Follow-up:  1. Follow up with PCP in 1 weeks 2. Aspirin started for primary prevention 3. Follow-up patient's atypical chest pain  Discharge Condition: Stable CODE STATUS: Full code   Brief/Interim Summary:  HPI written by Dr. Ivor Costa on 05/24/2016  HPI: Damon Christensen is a 50 y.o. male with medical history significant of thyroid cancer, s/p of thyroidectomy and secondary hypothyroidism, who presents with visual disturbance.  Pt states that he has intermittent visual disturbance in the past 6 months. It happened 5-6 times. He described as "prisms in his vision". Each time it lasts for about 5 minutes, then resolves spontaneously. He was seen by eye doctor two weeks ago and was told that his retina is fine. He was given a diagnosis of "ocular migraine. Pt states that he had one episode of double vision at noon yesterday, which lasted for about 5 min and resolved spontaneously. Patient states that occasionally he has numbness in the right thigh. Currently no unilateral weakness, numbness or tingling in extremities. 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   Pt also reports intermittent mild chest pain which has been going on for more than 2 years. The chest pain is located in the substernal area, pressure-like, 4-5 out of 10 in severity, nonradiating. It happens approximately once a week, lasts for about 5 min, resolvs spontaneously. Patient does not have shortness of breath, tenderness over calf areas. No fever or chills. Currently no chest pain. Patient denies nausea, vomiting, diarrhea, abdominal pain or symptoms of UTI.  ED Course: pt was found to have  normal temperature, no  tachycardia, no tachypnea, oxygen saturation normal, blood pressure normal. Pending CMP and CBC. Pt is placed on tele bed for obs. Neurology will be consulted by EDP.    Hospital course:  Diplopia Visual disturbance Patient evaluated by neurology. MRI/MRA negative for acute CVA. Preliminary carotid doppler results negative for significant stenosis. Echocardiogram normal. Patient's symptoms resolved before admission. Patient's symptoms likely secondary to ocular migraine as deduced by neurology/stroke team and patient's ophthalmologist. Aspirin started for primary prevention. LDL elevated but may be improved with dietary modification. This can be discussed as an outpatient.  Hypothyroidism Continued synthroid. TSH within normal limits.  Chest pain Atypical and occurring for the past two years. Patient reports negative stress test. EKG unremarkable for signs of ACS. Echocardiogram normal. Troponin negative.   Discharge Diagnoses:  Principal Problem:   Diplopia Active Problems:   Hypothyroidism   Chest pain   Visual disturbance   Transient cerebral ischemia    Discharge Instructions     Medication List    TAKE these medications   aspirin 81 MG tablet Take 1 tablet (81 mg total) by mouth daily. Start taking on:  05/26/2016   levothyroxine 175 MCG tablet Commonly known as:  SYNTHROID, LEVOTHROID Take 175 mcg by mouth daily before breakfast.      Follow-up Information    SWAYNE,DAVID W, MD. Schedule an appointment as soon as possible for a visit in 1 week(s).   Specialty:  Family Medicine Why:  Hospital follow-up Contact information: [place] [address] [phone]          No Known Allergies  Consultations:  Neurology/Stroke team  Procedures/Studies: Mr Brain Wo Contrast  Result Date: 05/24/2016 CLINICAL DATA:  Double vision beginning yesterday. Headache today. Recent diagnosis of the optical migraine. EXAM: MRI HEAD  WITHOUT CONTRAST MRA HEAD WITHOUT CONTRAST TECHNIQUE: Multiplanar, multiecho pulse sequences of the brain and surrounding structures were obtained without intravenous contrast. Angiographic images of the head were obtained using MRA technique without contrast. COMPARISON:  CT 03/30/2009 FINDINGS: MRI HEAD FINDINGS Brain: Diffusion imaging does not show any acute or subacute infarction. Brainstem and cerebellum are normal. Cerebral hemispheres are normal except for a few scattered punctate foci of T2 and FLAIR signal in the white matter. These can be seen in normal individuals of this age or could represent small vessel insults either due to small-vessel disease, vasculitis, trauma or migraine related foci. No cortical or large vessel territory infarction. No mass lesion, hemorrhage, hydrocephalus or extra-axial collection. No pituitary mass. Vascular: Major vessels at the base of the brain show flow. Skull and upper cervical spine: Negative Sinuses/Orbits: Mild mucosal inflammation of the paranasal sinuses without evidence of advanced sinus disease. Other: None significant MRA HEAD FINDINGS Both internal carotid arteries are widely patent into the brain. The anterior and middle cerebral vessels are normal without proximal stenosis, aneurysm or vascular malformation. Both vertebral arteries are widely patent to the basilar. No basilar stenosis. Posterior circulation branch vessels are normal. IMPRESSION: Normal intracranial MR angiography. No acute or likely significant brain pathology. Few punctate white matter foci which can be seen in normal individuals. Alternatively, they could represent an early manifestation of small vessel disease, evidence of previous vasculitis or trauma, or could be migraine related foci. Electronically Signed   By: Nelson Chimes M.D.   On: 05/24/2016 21:09   Mr Jodene Nam Head/brain [id number] Cm  Result Date: 05/24/2016 CLINICAL DATA:  Double vision beginning yesterday. Headache today. Recent  diagnosis of the optical migraine. EXAM: MRI HEAD WITHOUT CONTRAST MRA HEAD WITHOUT CONTRAST TECHNIQUE: Multiplanar, multiecho pulse sequences of the brain and surrounding structures were obtained without intravenous contrast. Angiographic images of the head were obtained using MRA technique without contrast. COMPARISON:  CT 03/30/2009 FINDINGS: MRI HEAD FINDINGS Brain: Diffusion imaging does not show any acute or subacute infarction. Brainstem and cerebellum are normal. Cerebral hemispheres are normal except for a few scattered punctate foci of T2 and FLAIR signal in the white matter. These can be seen in normal individuals of this age or could represent small vessel insults either due to small-vessel disease, vasculitis, trauma or migraine related foci. No cortical or large vessel territory infarction. No mass lesion, hemorrhage, hydrocephalus or extra-axial collection. No pituitary mass. Vascular: Major vessels at the base of the brain show flow. Skull and upper cervical spine: Negative Sinuses/Orbits: Mild mucosal inflammation of the paranasal sinuses without evidence of advanced sinus disease. Other: None significant MRA HEAD FINDINGS Both internal carotid arteries are widely patent into the brain. The anterior and middle cerebral vessels are normal without proximal stenosis, aneurysm or vascular malformation. Both vertebral arteries are widely patent to the basilar. No basilar stenosis. Posterior circulation branch vessels are normal. IMPRESSION: Normal intracranial MR angiography. No acute or likely significant brain pathology. Few punctate white matter foci which can be seen in normal individuals. Alternatively, they could represent an early manifestation of small vessel disease, evidence of previous vasculitis or trauma, or could be migraine related foci. Electronically Signed   By: Nelson Chimes M.D.   On: 05/24/2016 21:09    Echocardiogram (05/25/2016) Study Conclusions  - Left ventricle: The cavity  size was normal. Systolic function was   normal. The estimated ejection fraction was in the range of 55%   to 60%. Wall motion was normal; there were no regional wall   motion abnormalities.   Subjective: Patient reports no issues overnight. No deficits  Discharge Exam: Vitals:   05/25/16 0831 05/25/16 1044  BP: 118/84 118/66  Pulse: 70 68  Resp:  16  Temp: 98.3 F (36.8 C) 97.5 F (36.4 C)   Vitals:   05/25/16 0430 05/25/16 0600 05/25/16 0831 05/25/16 1044  BP: 105/63 107/83 118/84 118/66  Pulse: (!) 58 61 70 68  Resp:    16  Temp:  98.3 F (36.8 C) 98.3 F (36.8 C) 97.5 F (36.4 C)  TempSrc:  Oral Oral Oral  SpO2:   98% 98%  Weight:   103.4 kg (228 lb)   Height:   6' (1.829 m)     General: Pt is alert, awake, not in acute distress Eyes: EOMI Cardiovascular: RRR, S1/S2 +, no rubs, no gallops Respiratory: CTA bilaterally, no wheezing, no rhonchi Abdominal: Soft, NT, ND, bowel sounds + Neurological: Alert, oriented, CN intact, 5/5 strength. Reflexes 2+ and equal bilaterally Extremities: no edema, no cyanosis    The results of significant diagnostics from this hospitalization (including imaging, microbiology, ancillary and laboratory) are listed below for reference.     Microbiology: No results found for this or any previous visit (from the past 240 hour(s)).   Labs: BNP (last 3 results) No results for input(s): BNP in the last 8760 hours. Basic Metabolic Panel:  Recent Labs Lab 05/24/16 1908  NA 139  K 3.6  CL 106  CO2 23  GLUCOSE 115*  BUN 12  CREATININE 0.83  CALCIUM 9.2   Liver Function Tests:  Recent Labs Lab 05/24/16 1908  AST 23  ALT 28  ALKPHOS 55  BILITOT 0.6  PROT 6.8  ALBUMIN 3.9   No results for input(s): LIPASE, AMYLASE in the last 168 hours. No results for input(s): AMMONIA in the last 168 hours. CBC:  Recent Labs Lab 05/24/16 1908  WBC 10.5  NEUTROABS 7.1  HGB 15.3  HCT 42.5  MCV 89.7  PLT 198   Cardiac  Enzymes:  Recent Labs Lab 05/24/16 1908 05/25/16 0132 05/25/16 0717  TROPONINI <0.03 <0.03 <0.03   BNP: Invalid input(s): POCBNP CBG:  Recent Labs Lab 05/25/16 0757  GLUCAP 94   D-Dimer No results for input(s): DDIMER in the last 72 hours. Hgb A1c No results for input(s): HGBA1C in the last 72 hours. Lipid Profile  Recent Labs  05/25/16 0132  CHOL 182  HDL 23*  LDLCALC 105*  TRIG 270*  CHOLHDL 7.9   Thyroid function studies  Recent Labs  05/25/16 0132  TSH 1.065   Anemia work up No results for input(s): VITAMINB12, FOLATE, FERRITIN, TIBC, IRON, RETICCTPCT in the last 72 hours. Urinalysis No results found for: COLORURINE, APPEARANCEUR, LABSPEC, [place], GLUCOSEU, HGBUR, BILIRUBINUR, KETONESUR, PROTEINUR, UROBILINOGEN, NITRITE, LEUKOCYTESUR Sepsis Labs Invalid input(s): PROCALCITONIN,  WBC,  LACTICIDVEN Microbiology No results found for this or any previous visit (from the past 240 hour(s)).   Time coordinating discharge: Over 30 minutes  SIGNED:   Cordelia Poche, MD [facility] 05/25/2016, 2:08 PM Pager [phone]  If 7PM-7AM, please contact night-coverage [email or web address] Password [email or web address]

## 2016-05-25 NOTE — Progress Notes (Signed)
STROKE TEAM PROGRESS NOTE   HISTORY OF PRESENT ILLNESS (per record) Damon Christensen is an 50 y.o. male history of thyroid cancer and hypothyroidism who experienced an episode of diplopia on 05/23/2016 which lasted about 5 minutes. Images were clearly separated and diagonal an arrangement. There was no associated headache. He had no dizziness. There was no focal weakness. Speech remained unchanged. He is also complaining of episodes of visual change involving the left peripheral visual field the crescent shaped area of blurred vision and scintillating scotomas. Most recent episode occurred today and lasted only a few minutes. Onset was sudden rather than gradual. He had a mild headache associated with today's symptoms. MRI of his brain tonight showed no signs of an acute stroke. MRA was unremarkable. He was LKW at 12:00 noon on 05/23/2016. Patient was not administered IV t-PA secondary to deficits rapidly resolved; beyond time window for treatment consideration. He was admitted for further evaluation and treatment.   SUBJECTIVE (INTERVAL HISTORY) His wife is at the bedside.  Overall he feels his condition is completely resolved. Wife reports he has frequent HA, more than the average person.   OBJECTIVE Temp:  [97.5 F (36.4 C)-98.3 F (36.8 C)] 98.3 F (36.8 C) (10/19 0831) Pulse Rate:  [53-80] 70 (10/19 0831) Cardiac Rhythm: Normal sinus rhythm (10/19 0705) Resp:  [14-27] 18 (10/19 0127) BP: (83-138)/(46-106) 118/84 (10/19 0831) SpO2:  [91 %-100 %] 98 % (10/19 0831) Weight:  [103.4 kg (228 lb)] 103.4 kg (228 lb) (10/19 0831)  CBC:  Recent Labs Lab 05/24/16 1908  WBC 10.5  NEUTROABS 7.1  HGB 15.3  HCT 42.5  MCV 89.7  PLT 99991111    Basic Metabolic Panel:  Recent Labs Lab 05/24/16 1908  NA 139  K 3.6  CL 106  CO2 23  GLUCOSE 115*  BUN 12  CREATININE 0.83  CALCIUM 9.2    Lipid Panel:    Component Value Date/Time   CHOL 182 05/25/2016 0132   TRIG 270 (H) 05/25/2016 0132    HDL 23 (L) 05/25/2016 0132   CHOLHDL 7.9 05/25/2016 0132   VLDL 54 (H) 05/25/2016 0132   LDLCALC 105 (H) 05/25/2016 0132   HgbA1c: No results found for: HGBA1C Urine Drug Screen:    Component Value Date/Time   LABOPIA NONE DETECTED 05/24/2016 2010   COCAINSCRNUR NONE DETECTED 05/24/2016 2010   LABBENZ NONE DETECTED 05/24/2016 2010   AMPHETMU NONE DETECTED 05/24/2016 2010   THCU POSITIVE (A) 05/24/2016 2010   LABBARB NONE DETECTED 05/24/2016 2010      IMAGING  Mr Brain Wo Contrast  Result Date: 05/24/2016 CLINICAL DATA:  Double vision beginning yesterday. Headache today. Recent diagnosis of the optical migraine. EXAM: MRI HEAD WITHOUT CONTRAST MRA HEAD WITHOUT CONTRAST TECHNIQUE: Multiplanar, multiecho pulse sequences of the brain and surrounding structures were obtained without intravenous contrast. Angiographic images of the head were obtained using MRA technique without contrast. COMPARISON:  CT 03/30/2009 FINDINGS: MRI HEAD FINDINGS Brain: Diffusion imaging does not show any acute or subacute infarction. Brainstem and cerebellum are normal. Cerebral hemispheres are normal except for a few scattered punctate foci of T2 and FLAIR signal in the white matter. These can be seen in normal individuals of this age or could represent small vessel insults either due to small-vessel disease, vasculitis, trauma or migraine related foci. No cortical or large vessel territory infarction. No mass lesion, hemorrhage, hydrocephalus or extra-axial collection. No pituitary mass. Vascular: Major vessels at the base of the brain show flow. Skull and  upper cervical spine: Negative Sinuses/Orbits: Mild mucosal inflammation of the paranasal sinuses without evidence of advanced sinus disease. Other: None significant MRA HEAD FINDINGS Both internal carotid arteries are widely patent into the brain. The anterior and middle cerebral vessels are normal without proximal stenosis, aneurysm or vascular malformation. Both  vertebral arteries are widely patent to the basilar. No basilar stenosis. Posterior circulation branch vessels are normal. IMPRESSION: Normal intracranial MR angiography. No acute or likely significant brain pathology. Few punctate white matter foci which can be seen in normal individuals. Alternatively, they could represent an early manifestation of small vessel disease, evidence of previous vasculitis or trauma, or could be migraine related foci. Electronically Signed   By: Nelson Chimes M.D.   On: 05/24/2016 21:09   Mr Jodene Nam Head/brain [id number] Cm  Result Date: 05/24/2016 CLINICAL DATA:  Double vision beginning yesterday. Headache today. Recent diagnosis of the optical migraine. EXAM: MRI HEAD WITHOUT CONTRAST MRA HEAD WITHOUT CONTRAST TECHNIQUE: Multiplanar, multiecho pulse sequences of the brain and surrounding structures were obtained without intravenous contrast. Angiographic images of the head were obtained using MRA technique without contrast. COMPARISON:  CT 03/30/2009 FINDINGS: MRI HEAD FINDINGS Brain: Diffusion imaging does not show any acute or subacute infarction. Brainstem and cerebellum are normal. Cerebral hemispheres are normal except for a few scattered punctate foci of T2 and FLAIR signal in the white matter. These can be seen in normal individuals of this age or could represent small vessel insults either due to small-vessel disease, vasculitis, trauma or migraine related foci. No cortical or large vessel territory infarction. No mass lesion, hemorrhage, hydrocephalus or extra-axial collection. No pituitary mass. Vascular: Major vessels at the base of the brain show flow. Skull and upper cervical spine: Negative Sinuses/Orbits: Mild mucosal inflammation of the paranasal sinuses without evidence of advanced sinus disease. Other: None significant MRA HEAD FINDINGS Both internal carotid arteries are widely patent into the brain. The anterior and middle cerebral vessels are normal without proximal  stenosis, aneurysm or vascular malformation. Both vertebral arteries are widely patent to the basilar. No basilar stenosis. Posterior circulation branch vessels are normal. IMPRESSION: Normal intracranial MR angiography. No acute or likely significant brain pathology. Few punctate white matter foci which can be seen in normal individuals. Alternatively, they could represent an early manifestation of small vessel disease, evidence of previous vasculitis or trauma, or could be migraine related foci. Electronically Signed   By: Nelson Chimes M.D.   On: 05/24/2016 21:09       PHYSICAL EXAM Pleasant middle aged 80 male not in distress.  . Afebrile. Head is nontraumatic. Neck is supple without bruit.    Cardiac exam no murmur or gallop. Lungs are clear to auscultation. Distal pulses are well felt. Neurological Exam ;  Awake  Alert oriented x 3. Normal speech and language.eye movements full without nystagmus.fundi were not visualized. Vision acuity and fields appear normal. Hearing is normal. Palatal movements are normal. Face symmetric. Tongue midline. Normal strength, tone, reflexes and coordination. Normal sensation. Gait deferred.  ASSESSMENT/PLAN Mr. Damon Christensen is a 50 y.o. male with history of thyroid cancer presenting with isolated double vision x 5 mins, recurrent transient visual aura. He did not receive IV t-PA due to deficits rapidly resolved; beyond time window for treatment consideration.   Ocular Migraine, doubt TIA  Does not describe typical migraine symptoms. Has occurred 6x pat 6 months, twice in past 1 week  OTC medications do not help  MRI  No acute stroke  MRA  normal  Carotid Doppler  pending   2D Echo  pending   LDL 105  HgbA1c pending  Lovenox 40 mg sq daily for VTE prophylaxis  Diet Heart Room service appropriate? Yes; Fluid consistency: Thin  No antithrombotic prior to admission, now on aspirin 325 mg daily  Therapy recommendations:  No therapy  needs  Disposition:  Return home  Hyperlipidemia  Home meds:  No statin  LDL 105  Given no stroke, recommend diet control, continuation of weight loss (recent 20# weight loss)  Other Stroke Risk Factors  Former Cigarette smoker  ETOH use, advised to drink no more than 2 drink(s) a day  THC use, UDS positive on admission  Obesity, Body mass index is 30.92 kg/m., recommend weight loss, diet and exercise as appropriate   Other Active Problems  Intermittent atypical chest pain  Hospital day # 0  Radene Journey [facility] [place] for Pager information 05/25/2016 10:12 AM  I have personally examined this patient, reviewed notes, independently viewed imaging studies, participated in medical decision making and plan of care.ROS completed by me personally and pertinent positives fully documented  I have made any additions or clarifications directly to the above note. Agree with note above. He has history of recurrent transient episodes of crescent-shaped lesion scintillating scotomas likely optical migraines. He had somewhat different episode yesterday which also is in the migraine variant. MRI scan was negative for acute infarct, structural lesion. Recommend finish ongoing stroke evaluation and follow-up as an outpatient as needed. Greater than 50% time during this 25 minute visit was spent on counseling and coordination of care about migraine, stroke and answering questions  Antony Contras, MD Medical Director [place] Pager: [phone] 05/25/2016 3:18 PM  To contact Stroke Continuity provider, please refer to [email or web address] After hours, contact General Neurology

## 2016-05-25 NOTE — Progress Notes (Signed)
VASCULAR LAB PRELIMINARY  PRELIMINARY  PRELIMINARY  PRELIMINARY  Carotid duplex completed.    Preliminary report: No significant ICA stenosis.  Vertebral artery flow is antegrade.   Nabiha Planck, RVT 05/25/2016, 1:31 PM

## 2016-05-25 NOTE — Care Management Note (Signed)
Case Management Note  Patient Details  Name: Damon Christensen MRN: [id number] Date of Birth: 03/15/1966  Subjective/Objective:                 In obs for diplopia, ruled out CVA, anticipate DC to home self care today.   Action/Plan:   Expected Discharge Date:                  Expected Discharge Plan:  Home/Self Care  In-House Referral:  NA  Discharge planning Services  CM Consult  Post Acute Care Choice:  NA Choice offered to:  NA  DME Arranged:  N/A DME Agency:  NA  HH Arranged:  NA HH Agency:  NA  Status of Service:  Completed, signed off  If discussed at [place] of Stay Meetings, dates discussed:    Additional Comments:  Carles Collet, RN 05/25/2016, 1:22 PM

## 2016-05-25 NOTE — Progress Notes (Signed)
  Echocardiogram 2D Echocardiogram has been performed.  Cynithia Hakimi 05/25/2016, 3:00 PM

## 2016-05-25 NOTE — Discharge Instructions (Signed)
Damon Christensen, you were admitted because of your double/blurry vision which was concerning for a stroke vs mini-stroke. Your workup has been negative for a stroke, and after evaluation by the neurologist, occular migraine is the leading diagnosis. You have been started on a daily aspirin. Your cholesterol was slightly high, but a cholesterol lowering drug was not started on this admission. This can be discussed with your primary care physician.

## 2016-05-25 NOTE — Progress Notes (Signed)
PT Cancellation Note  Patient Details Name: Damon Christensen MRN: [id number] DOB: Jan 15, 1966   Cancelled Treatment:    Reason Eval/Treat Not Completed: Patient declined, no reason specified (feels he is doing fine, at baseline).  Asked pt and wife and nurse to call PT if he changes his mind.   Ramond Dial 05/25/2016, 9:54 AM    Mee Hives, PT MS Acute Rehab Dept. Number: [place] and [place]

## 2016-05-26 LAB — HEMOGLOBIN A1C
Hgb A1c MFr Bld: 5.1 % (ref 4.8–5.6)
Mean Plasma Glucose: 100 mg/dL

## 2016-05-28 LAB — VAS US CAROTID
LCCAPDIAS: 21 cm/s
LCCAPSYS: 88 cm/s
LEFT ECA DIAS: -11 cm/s
LEFT VERTEBRAL DIAS: -20 cm/s
LICADDIAS: -24 cm/s
LICAPSYS: -63 cm/s
Left CCA dist dias: -27 cm/s
Left CCA dist sys: -73 cm/s
Left ICA dist sys: -53 cm/s
Left ICA prox dias: -20 cm/s
RCCADSYS: -46 cm/s
RCCAPDIAS: -12 cm/s
RIGHT ECA DIAS: -14 cm/s
RIGHT VERTEBRAL DIAS: 10 cm/s
Right CCA prox sys: -54 cm/s

## 2017-04-28 IMAGING — MR MR MRA HEAD W/O CM
9 of 11 series · 30 of 48 positions shown · non-contrast
Comparison: CT 03/30/2009

CLINICAL DATA: Double vision beginning yesterday. Headache today.
Recent diagnosis of the optical migraine.

EXAM:
MRI HEAD WITHOUT CONTRAST
MRA HEAD WITHOUT CONTRAST
TECHNIQUE: Multiplanar, multiecho pulse sequences of the brain and surrounding
structures were obtained without intravenous contrast. Angiographic
images of the head were obtained using MRA technique without
contrast.

[Series 3: DWI · axial · 3.0mm · 0.94mm/px · z∈[-47,+93]mm · 6 of 96 slices shown (1 of 2)]
[im 1/96]
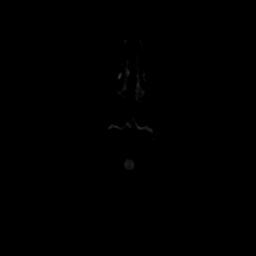
[im 20/96]
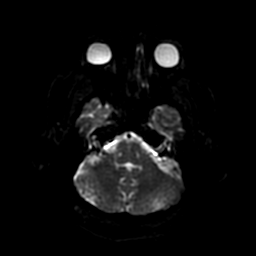
[im 39/96]
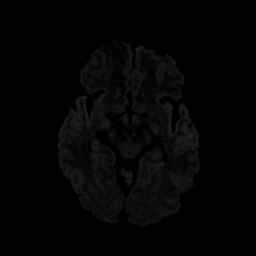
[im 58/96]
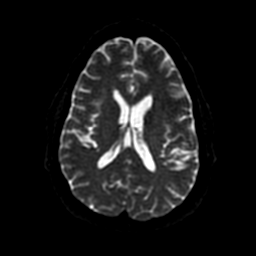
[im 77/96]
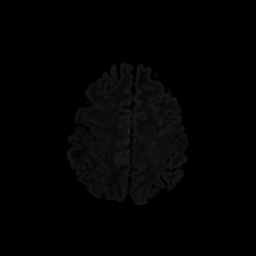
[im 96/96]
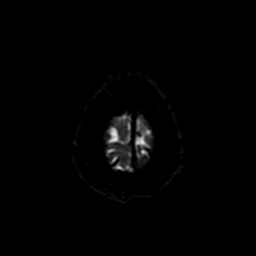

[Series 4: T2 · axial · 5.0mm · 0.47mm/px · z∈[-46,+92]mm · 2 of 24 slices shown (1 of 2)]
[im 1/24]
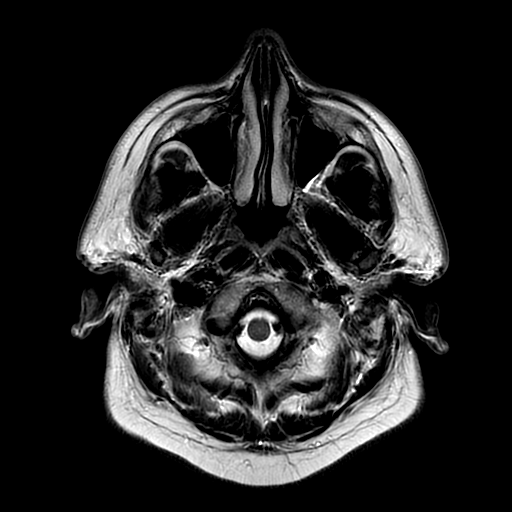
[im 24/24]
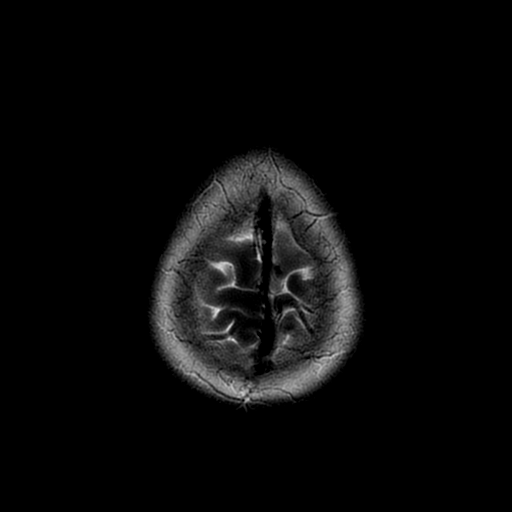

[Series 5: DWI · coronal · 4.0mm · 0.94mm/px · 5 of 72 slices shown (2 of 2)]
[im 1/72]
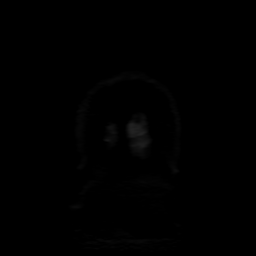
[im 18/72]
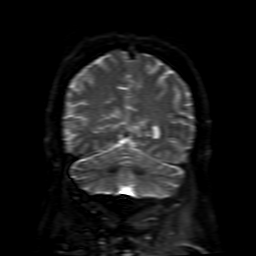
[im 36/72]
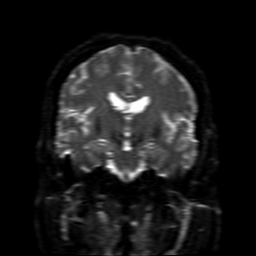
[im 54/72]
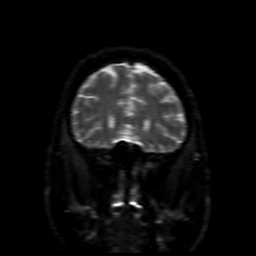
[im 72/72]
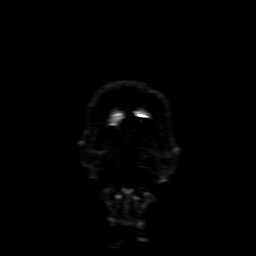

[Series 6: ax (id) 2 · axial · 1.0mm · 0.43mm/px · z∈[-55,-2]mm · 5 of 184 slices shown]
[im 1/184]
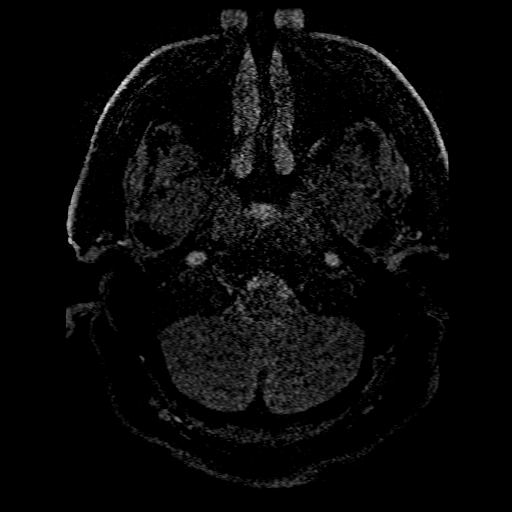
[im 31/184]
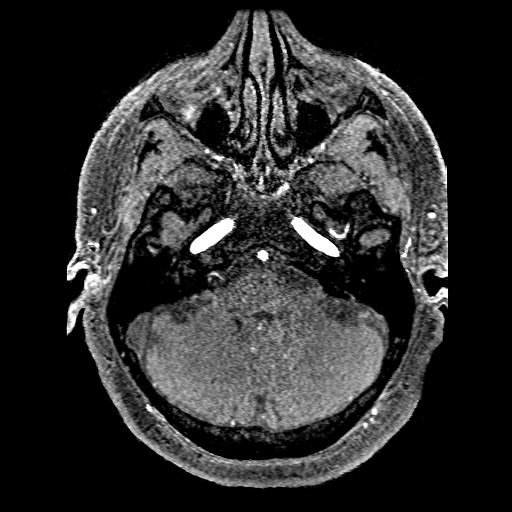
[im 62/184]
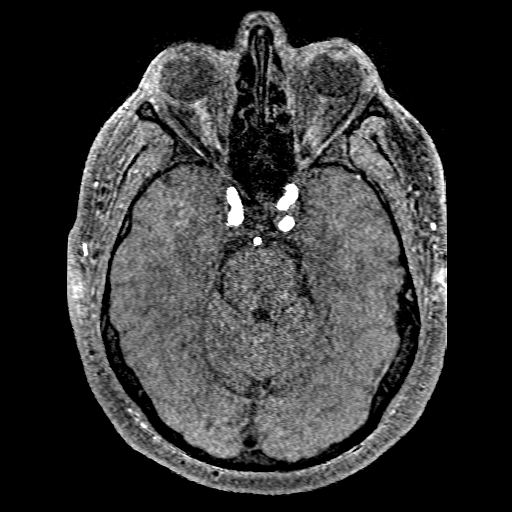
[im 77/184]
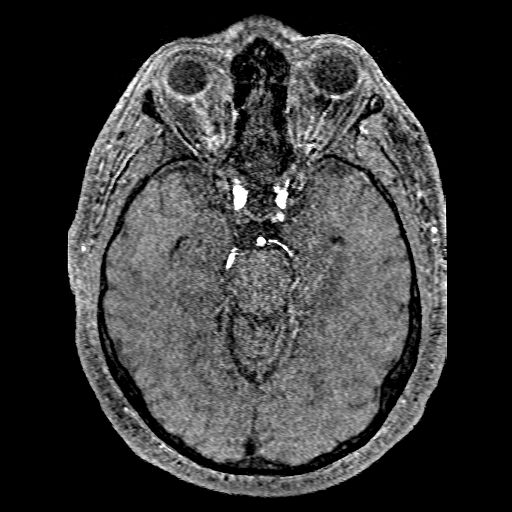
[im 107/184]
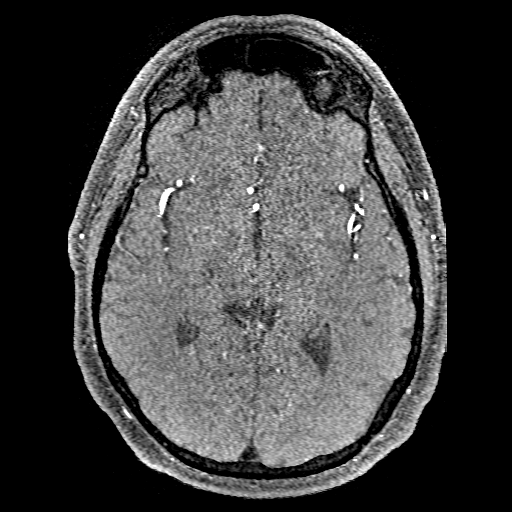

[Series 7: FLAIR · axial · 5.0mm · 0.47mm/px · z∈[-46,+92]mm · 2 of 24 slices shown (1 of 2)]
[im 1/24]
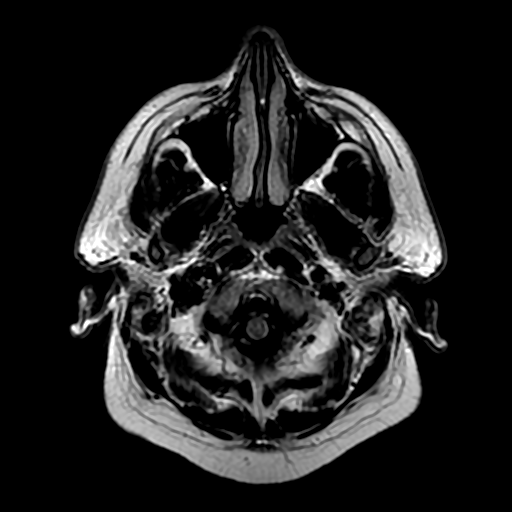
[im 24/24]
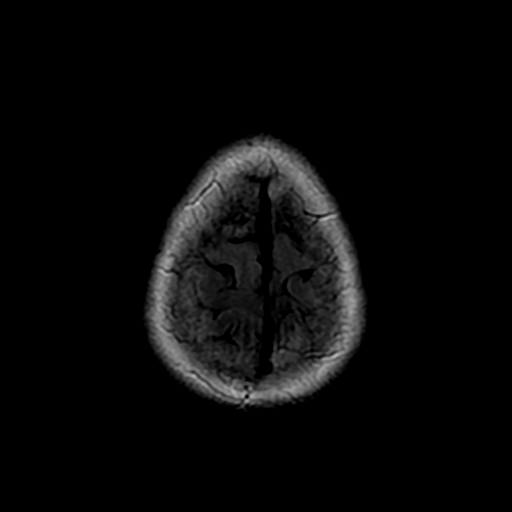

[Series 9: FLAIR · sagittal · 5.0mm · 0.51mm/px · 2 of 23 slices shown (2 of 2)]
[im 1/23]
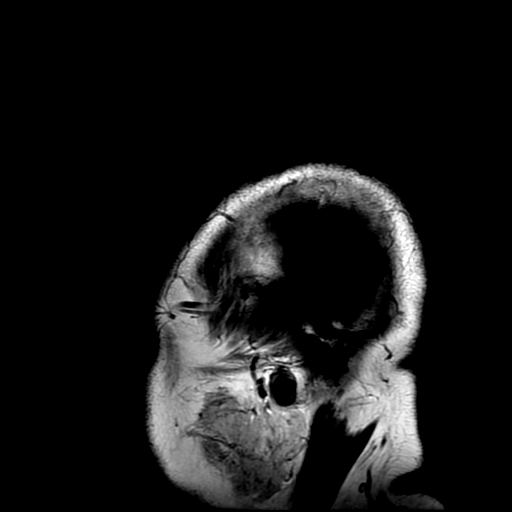
[im 23/23]
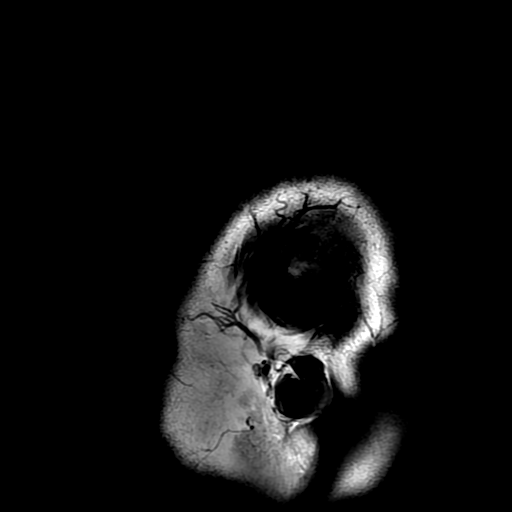

[Series 11: T2 · coronal · 5.0mm · 0.39mm/px · 2 of 30 slices shown (2 of 2)]
[im 1/30]
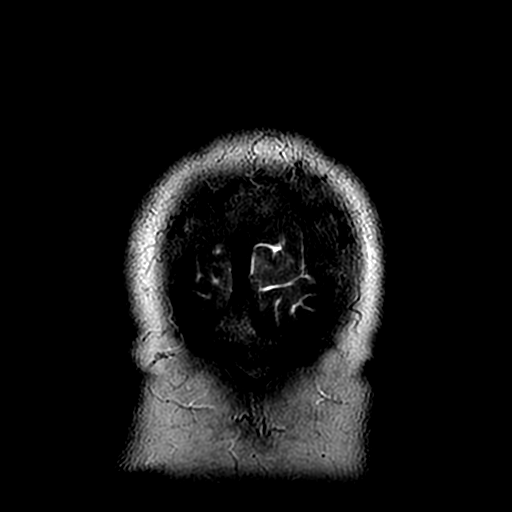
[im 30/30]
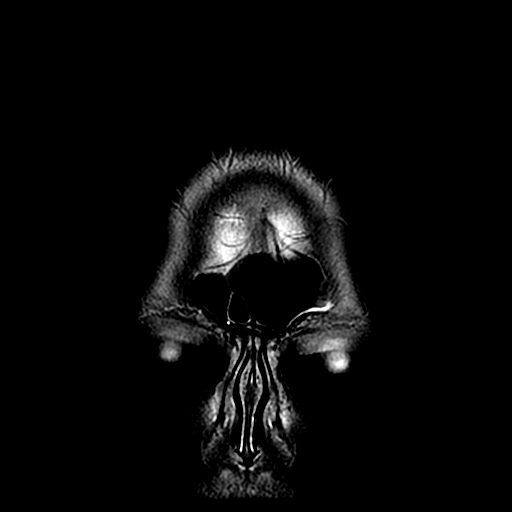

[Series 350: ADC · axial · 3.0mm · 0.94mm/px · z∈[-47,+93]mm · 3 of 48 slices shown (1 of 2)]
[im 1/48]
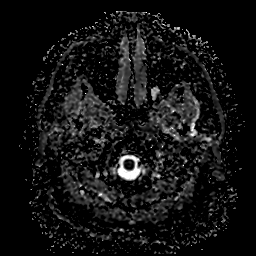
[im 24/48]
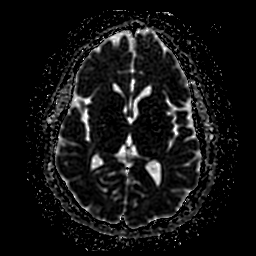
[im 48/48]
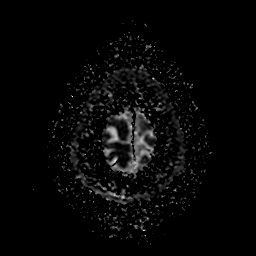

[Series 550: ADC · coronal · 4.0mm · 0.94mm/px · 3 of 36 slices shown (2 of 2)]
[im 1/36]
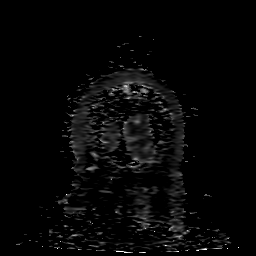
[im 18/36]
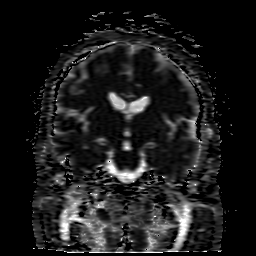
[im 36/36]
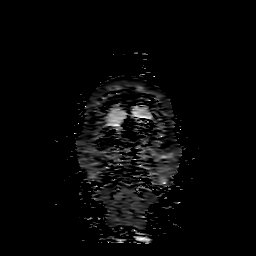

[30 of 48 positions shown; findings below may reference images not displayed]

FINDINGS: MRI HEAD FINDINGS

Brain: Diffusion imaging does not show any acute or subacute
infarction. Brainstem and cerebellum are normal. Cerebral
hemispheres are normal except for a few scattered punctate foci of
T2 and FLAIR signal in the white matter. These can be seen in normal
individuals of this age or could represent small vessel insults
either due to small-vessel disease, vasculitis, trauma or migraine
related foci. No cortical or large vessel territory infarction. No
mass lesion, hemorrhage, hydrocephalus or extra-axial collection. No
pituitary mass.

Vascular: Major vessels at the base of the brain show flow.

Skull and upper cervical spine: Negative

Sinuses/Orbits: Mild mucosal inflammation of the paranasal sinuses
without evidence of advanced sinus disease.

Other: None significant

MRA HEAD FINDINGS

Both internal carotid arteries are widely patent into the brain. The
anterior and middle cerebral vessels are normal without proximal
stenosis, aneurysm or vascular malformation. Both vertebral arteries
are widely patent to the basilar. No basilar stenosis. Posterior
circulation branch vessels are normal.
IMPRESSION: Normal intracranial MR angiography.

No acute or likely significant brain pathology. Few punctate white
matter foci which can be seen in normal individuals. Alternatively,
they could represent an early manifestation of small vessel disease,
evidence of previous vasculitis or trauma, or could be migraine
related foci.
